# Patient Record
Sex: Female | Born: 1985 | Race: White | Hispanic: No | Marital: Single | Smoking: Former smoker
Health system: Southern US, Community
[De-identification: ages and names within clinical notes are randomized; demographics above are authoritative.]

## PROBLEM LIST (undated history)

## (undated) ENCOUNTER — Inpatient Hospital Stay: Payer: Self-pay

---

## 2015-01-21 LAB — OB RESULTS CONSOLE RPR: RPR: NONREACTIVE

## 2015-01-21 LAB — OB RESULTS CONSOLE RUBELLA ANTIBODY, IGM: RUBELLA: IMMUNE

## 2015-01-21 LAB — OB RESULTS CONSOLE HEPATITIS B SURFACE ANTIGEN: Hepatitis B Surface Ag: NEGATIVE

## 2015-01-21 LAB — OB RESULTS CONSOLE ABO/RH: RH TYPE: POSITIVE

## 2015-01-21 LAB — OB RESULTS CONSOLE VARICELLA ZOSTER ANTIBODY, IGG: Varicella: IMMUNE

## 2015-01-21 LAB — OB RESULTS CONSOLE HIV ANTIBODY (ROUTINE TESTING): HIV: NONREACTIVE

## 2015-04-02 ENCOUNTER — Inpatient Hospital Stay
Admission: EM | Admit: 2015-04-02 | Discharge: 2015-04-03 | DRG: 781 | Disposition: A | Discharge status: Home / Self Care | Payer: Medicaid Other | Attending: Obstetrics and Gynecology | Admitting: Obstetrics and Gynecology

## 2015-04-02 ENCOUNTER — Inpatient Hospital Stay: Payer: Medicaid Other

## 2015-04-02 DIAGNOSIS — O26893 Other specified pregnancy related conditions, third trimester: Secondary | ICD-10-CM | POA: Diagnosis present

## 2015-04-02 DIAGNOSIS — O99613 Diseases of the digestive system complicating pregnancy, third trimester: Principal | ICD-10-CM | POA: Diagnosis present

## 2015-04-02 DIAGNOSIS — Z3A28 28 weeks gestation of pregnancy: Secondary | ICD-10-CM

## 2015-04-02 DIAGNOSIS — E876 Hypokalemia: Secondary | ICD-10-CM | POA: Diagnosis present

## 2015-04-02 DIAGNOSIS — O99283 Endocrine, nutritional and metabolic diseases complicating pregnancy, third trimester: Secondary | ICD-10-CM | POA: Diagnosis present

## 2015-04-02 DIAGNOSIS — K219 Gastro-esophageal reflux disease without esophagitis: Secondary | ICD-10-CM | POA: Diagnosis present

## 2015-04-02 DIAGNOSIS — R109 Unspecified abdominal pain: Secondary | ICD-10-CM

## 2015-04-02 LAB — COMPREHENSIVE METABOLIC PANEL
ALK PHOS: 86 U/L (ref 38–126)
ALT: 13 U/L — AB (ref 14–54)
AST: 20 U/L (ref 15–41)
Albumin: 3.5 g/dL (ref 3.5–5.0)
Anion gap: 11 (ref 5–15)
BILIRUBIN TOTAL: 0.6 mg/dL (ref 0.3–1.2)
BUN: 7 mg/dL (ref 6–20)
CO2: 22 mmol/L (ref 22–32)
CREATININE: 0.53 mg/dL (ref 0.44–1.00)
Calcium: 8.9 mg/dL (ref 8.9–10.3)
Chloride: 107 mmol/L (ref 101–111)
GFR calc Af Amer: >60 mL/min (ref >60)
Glucose, Bld: 94 mg/dL (ref 65–99)
Potassium: 2.7 mmol/L — CL (ref 3.5–5.1)
Sodium: 140 mmol/L (ref 135–145)
TOTAL PROTEIN: 6.8 g/dL (ref 6.5–8.1)

## 2015-04-02 LAB — URINALYSIS COMPLETE WITH MICROSCOPIC (ARMC ONLY)
BILIRUBIN URINE: NEGATIVE
GLUCOSE, UA: NEGATIVE mg/dL
Hgb urine dipstick: NEGATIVE
Leukocytes, UA: NEGATIVE
Nitrite: NEGATIVE
Protein, ur: NEGATIVE mg/dL
Specific Gravity, Urine: 1.013 (ref 1.005–1.030)
pH: 6 (ref 5.0–8.0)

## 2015-04-02 LAB — CBC
HCT: 33.4 % — ABNORMAL LOW (ref 35.0–47.0)
Hemoglobin: 10.9 g/dL — ABNORMAL LOW (ref 12.0–16.0)
MCH: 28.4 pg (ref 26.0–34.0)
MCHC: 32.6 g/dL (ref 32.0–36.0)
MCV: 87.3 fL (ref 80.0–100.0)
PLATELETS: 205 10*3/uL (ref 150–440)
RBC: 3.82 MIL/uL (ref 3.80–5.20)
RDW: 12.9 % (ref 11.5–14.5)
WBC: 11.5 10*3/uL — AB (ref 3.6–11.0)

## 2015-04-02 LAB — URINE DRUG SCREEN, QUALITATIVE (ARMC ONLY)
AMPHETAMINES, UR SCREEN: NOT DETECTED
BARBITURATES, UR SCREEN: NOT DETECTED
BENZODIAZEPINE, UR SCRN: NOT DETECTED
COCAINE METABOLITE, UR ~~LOC~~: NOT DETECTED
Cannabinoid 50 Ng, Ur ~~LOC~~: NOT DETECTED
MDMA (Ecstasy)Ur Screen: NOT DETECTED
METHADONE SCREEN, URINE: NOT DETECTED
OPIATE, UR SCREEN: NOT DETECTED
PHENCYCLIDINE (PCP) UR S: NOT DETECTED
Tricyclic, Ur Screen: NOT DETECTED

## 2015-04-02 MED ORDER — POTASSIUM CHLORIDE 2 MEQ/ML IV SOLN
INTRAVENOUS | Status: DC
  Administered 2015-04-02: 19:00:00 via INTRAVENOUS
  Filled 2015-04-02 (×17): qty 1000

## 2015-04-02 MED ORDER — SODIUM CHLORIDE 0.9 % IV SOLN
80.0000 mg | Freq: Once | INTRAVENOUS | Status: AC
  Administered 2015-04-03: 80 mg via INTRAVENOUS
  Filled 2015-04-02: qty 80

## 2015-04-02 MED ORDER — POTASSIUM CHLORIDE CRYS ER 20 MEQ PO TBCR
20.0000 meq | EXTENDED_RELEASE_TABLET | Freq: Four times a day (QID) | ORAL | Status: DC
  Administered 2015-04-03 (×2): 20 meq via ORAL
  Filled 2015-04-02 (×2): qty 1

## 2015-04-02 MED ORDER — TERBUTALINE SULFATE 1 MG/ML IJ SOLN
INTRAMUSCULAR | Status: AC
  Administered 2015-04-02: 0.25 mg via SUBCUTANEOUS
  Filled 2015-04-02: qty 1

## 2015-04-02 MED ORDER — TERBUTALINE SULFATE 1 MG/ML IJ SOLN
0.2500 mg | Freq: Once | INTRAMUSCULAR | Status: AC
  Administered 2015-04-02: 0.25 mg via SUBCUTANEOUS

## 2015-04-02 NOTE — Progress Notes (Signed)
Patient ID: Doris Frye, female   DOB: 11/02/1985, 29 y.o.   MRN: [id number] Doris Frye 11/02/1985 G2 P0100  4558w3d presents for intermittent upper abd pain . Some back pain associated with it ..+ emesis x 1  noLOF , no vaginal bleeding , PMHX h/o anxiety , h/o methadone drug dependency  PSHX none PObhx : prior 22 week PTD + loss  Ros : unremarkable  Meds PNV , iron  Allergies NKDA O;BP 103/67 mmHg  Pulse 94  Temp(Src) 97.9 F (36.6 C) (Oral)  Resp 16  LMP 09/15/2014 ABDsoft NT , rebound  CX FFN done , cx long+ closed  NST reassuring , reactive , uterine irritability Labs: ua neg, CBC : CMP :   A: Upper abd pain , r/o cholelithiasis Uterine ctx , at risk for PRD given prior 22 week loss P:Gallblaader u/s  Sq terbutaline

## 2015-04-02 NOTE — OB Triage Note (Signed)
Mrs. Doris Frye reports to triage with complaints of intermittent abdominal cramps for the last month. The cramping starts in the abdomen and radiates to the back. They are sporadic, occurring every 2-3 days, lasting 30 mins-3 hrs with each episode, with pain 10/10.  Denies diarrhea, bleeding, burning with urination, headaches, swelling, visual disturbances, any pain at this moment..  Reports one occurrence of N/V, occassional constipation.

## 2015-04-02 NOTE — Progress Notes (Signed)
Patient ID: Doris Frye, female   DOB: 1986/05/18, 29 y.o.   MRN: [id number] Will do 30 min of fetal monitoring q 4 hrs and continuous toco

## 2015-04-02 NOTE — H&P (Signed)
Doris Frye is a 29 y.o. female presenting for cramping , upper abdominal pain  Which has been intermittent throughout pregnancy . EGA 28+3 week . CTX noted tonight . SHe has undergone an gallbaladder u/s which was negative and CMP which showed K+ 2.7 .Cx closed . S/P one terbutaline 0.25 mg injection  Prior h/o of stillbirth at 22+ weeks  History OB History    Gravida Para Term Preterm AB TAB SAB Ectopic Multiple Living   2 1  1  0  0   0     PMHX : methadone addiction remote  PHx; none  Family History: family history is not on file. Social History:addiction as above   Scientist, physiologicalrenatal Transfer Tool  Maternal Diabetes: No Genetic Screening: Normal Maternal Ultrasounds/Referrals: Normal Fetal Ultrasounds or other Referrals:  None Maternal Substance Abuse:  Yes:  Type: Methadone remote Significant Maternal Medications:  None Significant Maternal Lab Results:  Lab values include: Other:  Other Comments:  None  ROS  Dilation: Closed Effacement (%): Thick Station: Ballotable Exam by:: TJS Blood pressure 115/60, pulse 98, temperature 97.9 F (36.6 C), temperature source Oral, resp. rate 16, last menstrual period 09/15/2014. Exam Physical Exam  Lungs CTA  CV RRR  abd : soft , nNT  cx as above    Assessment/Plan: 28 week  Upper abd pain  With nl gallbladder u/s . Probable GERD  Hypokalemia( severe) IVF with KCL  Po diet regular  protonix 40 mg iv  Recheck CMP in am  Continuous monitoring    Doris Frye 04/02/2015, 7:21 PM

## 2015-04-03 LAB — COMPREHENSIVE METABOLIC PANEL
ALT: 12 U/L — AB (ref 14–54)
AST: 18 U/L (ref 15–41)
Albumin: 2.9 g/dL — ABNORMAL LOW (ref 3.5–5.0)
Alkaline Phosphatase: 68 U/L (ref 38–126)
Anion gap: 5 (ref 5–15)
BILIRUBIN TOTAL: 0.2 mg/dL — AB (ref 0.3–1.2)
BUN: 7 mg/dL (ref 6–20)
CALCIUM: 8.3 mg/dL — AB (ref 8.9–10.3)
CHLORIDE: 109 mmol/L (ref 101–111)
CO2: 22 mmol/L (ref 22–32)
CREATININE: 0.52 mg/dL (ref 0.44–1.00)
Glucose, Bld: 93 mg/dL (ref 65–99)
Potassium: 3.7 mmol/L (ref 3.5–5.1)
Sodium: 136 mmol/L (ref 135–145)
TOTAL PROTEIN: 5.9 g/dL — AB (ref 6.5–8.1)

## 2015-04-03 MED ORDER — SODIUM CHLORIDE 0.9 % IJ SOLN
INTRAMUSCULAR | Status: AC
  Filled 2015-04-03: qty 3

## 2015-04-03 NOTE — Discharge Summary (Signed)
Physician Discharge Summary  Patient ID: Wilmer Floorifanie Hartley MRN: [id number] DOB/AGE: 1985/11/25 29 y.o.  Admit date: 04/02/2015 Discharge date: 04/03/2015  Admission Diagnoses:[redacted] weeks EGA , Upper abd pain , Hypokalemia  Discharge Diagnoses: same as above, GERD Active Problems:   GERD (gastroesophageal reflux disease)   Discharged Condition: good  Hospital Course: pt was admitted to correct severe hypokalemia . K+on d/c 3.7 . Abd pain and ctx resolved. GERD tx with IV protonix   Consults: None  Significant Diagnostic Studies: labs: K+=2.7 corrected to 3.7.   Treatments: IV hydration and K+ replacement , IV protonix  Discharge Exam: Blood pressure 96/51, pulse 77, temperature 97.8 F (36.6 C), temperature source Oral, resp. rate 16, last menstrual period 09/15/2014. General appearance: alert and cooperative Resp: clear to auscultation bilaterally Cardio: regular rate and rhythm, S1, S2 normal, no murmur, click, rub or gallop GI: soft, non-tender; bowel sounds normal; no masses,  no organomegaly  Disposition: Final discharge disposition not confirmed  Discharge Instructions    Call MD for:  difficulty breathing, headache or visual disturbances    Complete by:  As directed      Call MD for:  extreme fatigue    Complete by:  As directed      Call MD for:  hives    Complete by:  As directed      Call MD for:  persistant dizziness or light-headedness    Complete by:  As directed      Call MD for:  persistant nausea and vomiting    Complete by:  As directed      Call MD for:  redness, tenderness, or signs of infection (pain, swelling, redness, odor or green/yellow discharge around incision site)    Complete by:  As directed      Call MD for:  severe uncontrolled pain    Complete by:  As directed      Diet - low sodium heart healthy    Complete by:  As directed      Discharge instructions    Complete by:  As directed   2 bananas a day     Increase activity slowly    Complete  by:  As directed             Medication List    Notice    You have not been prescribed any medications.         Follow-up Information    [facility]   Why:  As needed for routine care    Contact information:   [address] [place] [id number] [phone]       Signed: Jennell CornerSCHERMERHORN,Cletis Muma 04/03/2015, 8:43 AM

## 2015-04-03 NOTE — Discharge Instructions (Signed)

## 2015-05-05 ENCOUNTER — Observation Stay
Admission: EM | Admit: 2015-05-05 | Discharge: 2015-05-05 | Disposition: A | Discharge status: Home / Self Care | Payer: Medicaid Other | Attending: Obstetrics and Gynecology | Admitting: Obstetrics and Gynecology

## 2015-05-05 DIAGNOSIS — R109 Unspecified abdominal pain: Secondary | ICD-10-CM | POA: Insufficient documentation

## 2015-05-05 DIAGNOSIS — O26893 Other specified pregnancy related conditions, third trimester: Principal | ICD-10-CM | POA: Insufficient documentation

## 2015-05-05 DIAGNOSIS — R112 Nausea with vomiting, unspecified: Secondary | ICD-10-CM | POA: Insufficient documentation

## 2015-05-05 DIAGNOSIS — Z3A33 33 weeks gestation of pregnancy: Secondary | ICD-10-CM | POA: Insufficient documentation

## 2015-05-05 DIAGNOSIS — Z3490 Encounter for supervision of normal pregnancy, unspecified, unspecified trimester: Secondary | ICD-10-CM

## 2015-05-05 LAB — COMPREHENSIVE METABOLIC PANEL
ALT: 16 U/L (ref 14–54)
AST: 20 U/L (ref 15–41)
Albumin: 3.1 g/dL — ABNORMAL LOW (ref 3.5–5.0)
Alkaline Phosphatase: 105 U/L (ref 38–126)
Anion gap: 4 — ABNORMAL LOW (ref 5–15)
BUN: 8 mg/dL (ref 6–20)
CHLORIDE: 109 mmol/L (ref 101–111)
CO2: 24 mmol/L (ref 22–32)
CREATININE: 0.55 mg/dL (ref 0.44–1.00)
Calcium: 8.7 mg/dL — ABNORMAL LOW (ref 8.9–10.3)
Glucose, Bld: 100 mg/dL — ABNORMAL HIGH (ref 65–99)
POTASSIUM: 3.6 mmol/L (ref 3.5–5.1)
SODIUM: 137 mmol/L (ref 135–145)
Total Bilirubin: 0.5 mg/dL (ref 0.3–1.2)
Total Protein: 6.1 g/dL — ABNORMAL LOW (ref 6.5–8.1)

## 2015-05-05 LAB — URINALYSIS COMPLETE WITH MICROSCOPIC (ARMC ONLY)
BACTERIA UA: NONE SEEN
Bilirubin Urine: NEGATIVE
Glucose, UA: NEGATIVE mg/dL
Hgb urine dipstick: NEGATIVE
Ketones, ur: NEGATIVE mg/dL
LEUKOCYTES UA: NEGATIVE
Nitrite: NEGATIVE
PH: 6 (ref 5.0–8.0)
PROTEIN: NEGATIVE mg/dL
SPECIFIC GRAVITY, URINE: 1.014 (ref 1.005–1.030)

## 2015-05-05 LAB — CBC
HCT: 32.3 % — ABNORMAL LOW (ref 35.0–47.0)
HEMOGLOBIN: 10.7 g/dL — AB (ref 12.0–16.0)
MCH: 28.4 pg (ref 26.0–34.0)
MCHC: 33.1 g/dL (ref 32.0–36.0)
MCV: 86 fL (ref 80.0–100.0)
PLATELETS: 184 10*3/uL (ref 150–440)
RBC: 3.76 MIL/uL — AB (ref 3.80–5.20)
RDW: 13.1 % (ref 11.5–14.5)
WBC: 9.3 10*3/uL (ref 3.6–11.0)

## 2015-05-05 LAB — LIPASE, BLOOD: LIPASE: 27 U/L (ref 11–51)

## 2015-05-05 LAB — AMYLASE: AMYLASE: 57 U/L (ref 28–100)

## 2015-05-05 MED ORDER — CALCIUM CARBONATE ANTACID 500 MG PO CHEW: 2.0000 | CHEWABLE_TABLET | ORAL | Status: DC | PRN

## 2015-05-05 MED ORDER — ACETAMINOPHEN 325 MG PO TABS: 650.0000 mg | ORAL_TABLET | ORAL | Status: DC | PRN

## 2015-05-05 NOTE — OB Triage Note (Signed)
Pt oriented to obs rm 3 and placed on monitor. Pt is here with complaint of v/d starting last night with irregular contractions.  Also has c/o of upper back pain. Will cont to monitor.

## 2015-05-05 NOTE — Progress Notes (Addendum)
Patient ID: Doris Frye, female   DOB: March 28, 1986, 29 y.o.   MRN: [id number] Doris Frye March 28, 1986 G2 P0100 4351w0d presents for nausea / vo,iting and back pain started yesterday . No fever  Recent u/s shows no galbladder ds  noLOF , no vaginal bleeding , Good fetal movt  O;Temp(Src) 98.6 F (37 C) (Oral)  Ht 5' 6.5" (1.689 m)  Wt 149 lb (67.586 kg)  BMI 23.69 kg/m2  LMP 09/15/2014 PMHX h/o anxiety , h/o methadone drug dependency  PSHX none PObhx : prior 22 week PTD + loss  Ros : unremarkable  Meds PNV , iron  Allergies NKDA O;BP 103/67 mmHg  Pulse 94  Temp(Src) 97.9 F (36.6 C) (Oral)  Resp 16  LMP 09/15/2014 ABDsoft NT , no rebound  CX FFN done , cx long+ closed  NST reassuring , reactive , uterine irritability Labs: ua neg, CBC : CMP : amylase + lipase =all normal   A: Upper abd pain ,probable Viral GE , benign exam  P:zofran 8 mg tid prn  Imodium prn  Daily fetal kick counts  RTC if fever or sx worse

## 2015-05-05 NOTE — Discharge Instructions (Signed)
Please drink plenty of water and and rest. Please contact your OB for further questions and concern. In an emergency go to the [facility] Emergency Department.   Rx. Given Zofran 8 mg every hours as needed for nausea and vomiting.

## 2015-05-05 NOTE — OB Triage Note (Signed)
Pt stating that abdominal pain is in the upper gastric area, aching that sometimes leads to vomiting. After vomiting this morning that pain stayed and was not relieved. She called OB office and they suggested [facility] to be [facility].

## 2015-05-25 NOTE — L&D Delivery Note (Signed)
Operative Delivery Note At 11:37 PM a viable and healthy female "[name]" was delivered via Vaginal, Spontaneous Delivery.  Presentation: compound left arm; Position: Left,, Occiput,, Anterior; Station: +4.  Delivery of the head: 06/26/2015 11:37 PM First maneuver: 06/26/2015 11:38 PM, McRoberts Second maneuver: 06/26/2015 11:38 PM, Suprapubic Pressure McRoberts Third maneuver: 06/26/2015 11:39 AM,  Delivery of posterior arm  Verbal consent: obtained from patient.  APGAR: 2, 7; weight 8 lb 12 oz (3969 g).   Placenta status: Intact, Spontaneous.   Cord: 3 vessels with the following complications: .  Cord pH: 7.18, 7.4 deficit  Anesthesia: Epidural Local  Episiotomy:   Lacerations: Vaginal;Labial Suture Repair: 2.0 3.0 vicryl vicryl rapide Est. Blood Loss (mL): 250  Mom to postpartum.  Baby to NICU for transition, but was doing well.  Doris Frye 06/27/2015, 12:31 AM

## 2015-06-25 ENCOUNTER — Other Ambulatory Visit: Payer: Self-pay | Admitting: Obstetrics and Gynecology

## 2015-06-25 ENCOUNTER — Encounter: Payer: Self-pay | Admitting: *Deleted

## 2015-06-25 ENCOUNTER — Inpatient Hospital Stay
Admission: AD | Admit: 2015-06-25 | Discharge: 2015-06-28 | DRG: 775 | Disposition: A | Discharge status: Home / Self Care | Payer: Medicaid Other | Source: Ambulatory Visit | Attending: Obstetrics and Gynecology | Admitting: Obstetrics and Gynecology

## 2015-06-25 DIAGNOSIS — Z3A40 40 weeks gestation of pregnancy: Secondary | ICD-10-CM | POA: Diagnosis not present

## 2015-06-25 DIAGNOSIS — Z87891 Personal history of nicotine dependence: Secondary | ICD-10-CM | POA: Diagnosis not present

## 2015-06-25 DIAGNOSIS — O480 Post-term pregnancy: Secondary | ICD-10-CM | POA: Diagnosis present

## 2015-06-25 LAB — URINE DRUG SCREEN, QUALITATIVE (ARMC ONLY)
AMPHETAMINES, UR SCREEN: NOT DETECTED
BARBITURATES, UR SCREEN: NOT DETECTED
BENZODIAZEPINE, UR SCRN: NOT DETECTED
Cannabinoid 50 Ng, Ur ~~LOC~~: NOT DETECTED
Cocaine Metabolite,Ur ~~LOC~~: NOT DETECTED
MDMA (Ecstasy)Ur Screen: NOT DETECTED
METHADONE SCREEN, URINE: NOT DETECTED
OPIATE, UR SCREEN: NOT DETECTED
PHENCYCLIDINE (PCP) UR S: NOT DETECTED
Tricyclic, Ur Screen: NOT DETECTED

## 2015-06-25 LAB — CBC
HCT: 35.2 % (ref 35.0–47.0)
Hemoglobin: 12 g/dL (ref 12.0–16.0)
MCH: 29.1 pg (ref 26.0–34.0)
MCHC: 34.1 g/dL (ref 32.0–36.0)
MCV: 85.2 fL (ref 80.0–100.0)
Platelets: 194 10*3/uL (ref 150–440)
RBC: 4.13 MIL/uL (ref 3.80–5.20)
RDW: 13.6 % (ref 11.5–14.5)
WBC: 10.6 10*3/uL (ref 3.6–11.0)

## 2015-06-25 LAB — ABO/RH: ABO/RH(D): A POS

## 2015-06-25 LAB — TYPE AND SCREEN
ABO/RH(D): A POS
Antibody Screen: NEGATIVE

## 2015-06-25 MED ORDER — DINOPROSTONE 10 MG VA INST
10.0000 mg | VAGINAL_INSERT | Freq: Once | VAGINAL | Status: AC
Start: 2015-06-25 — End: 2015-06-25
  Administered 2015-06-25: 10 mg via VAGINAL
  Filled 2015-06-25 (×2): qty 1

## 2015-06-25 MED ORDER — OXYTOCIN BOLUS FROM INFUSION
500.0000 mL | INTRAVENOUS | Status: DC
  Administered 2015-06-26: 500 mL via INTRAVENOUS

## 2015-06-25 MED ORDER — CITRIC ACID-SODIUM CITRATE 334-500 MG/5ML PO SOLN: 30.0000 mL | ORAL | Status: DC | PRN

## 2015-06-25 MED ORDER — BUTORPHANOL TARTRATE 1 MG/ML IJ SOLN
1.0000 mg | INTRAMUSCULAR | Status: DC | PRN
  Administered 2015-06-26: 1 mg via INTRAVENOUS
  Filled 2015-06-25: qty 1

## 2015-06-25 MED ORDER — TERBUTALINE SULFATE 1 MG/ML IJ SOLN: 0.2500 mg | Freq: Once | INTRAMUSCULAR | Status: DC | PRN

## 2015-06-25 MED ORDER — LACTATED RINGERS IV SOLN
INTRAVENOUS | Status: DC
  Administered 2015-06-25 – 2015-06-26 (×3): via INTRAVENOUS

## 2015-06-25 MED ORDER — LACTATED RINGERS IV SOLN: 500.0000 mL | INTRAVENOUS | Status: DC | PRN

## 2015-06-25 MED ORDER — ONDANSETRON HCL 4 MG/2ML IJ SOLN: 4.0000 mg | Freq: Four times a day (QID) | INTRAMUSCULAR | Status: DC | PRN

## 2015-06-25 MED ORDER — LIDOCAINE HCL (PF) 1 % IJ SOLN
30.0000 mL | INTRAMUSCULAR | Status: AC | PRN
  Administered 2015-06-26: 3 mL via SUBCUTANEOUS

## 2015-06-25 MED ORDER — ACETAMINOPHEN 325 MG PO TABS
650.0000 mg | ORAL_TABLET | ORAL | Status: DC | PRN
  Administered 2015-06-26 – 2015-06-27 (×4): 650 mg via ORAL
  Filled 2015-06-25 (×4): qty 2

## 2015-06-25 MED ORDER — OXYTOCIN 10 UNIT/ML IJ SOLN
2.5000 [IU]/h | INTRAVENOUS | Status: DC
  Filled 2015-06-25: qty 4

## 2015-06-25 MED ORDER — ZOLPIDEM TARTRATE 5 MG PO TABS
5.0000 mg | ORAL_TABLET | Freq: Every evening | ORAL | Status: DC | PRN
  Administered 2015-06-25: 5 mg via ORAL
  Filled 2015-06-25: qty 1

## 2015-06-25 NOTE — H&P (Signed)
OB ADMISSION/ HISTORY & PHYSICAL:  Admission Date: 06/25/15 Admit Diagnosis: Elective IOL for postdates at 40+2 weeks   Doris Frye is a 30 y.o. female presenting for elective IOL for postdates at 40+2 weeks.   Prenatal History: G2P0100 , by UNSURE LMP of 09/06/14, confirmed by [place] at 8+2 weeks with EDD of 06/23/15 EDC : 06/23/2015, by Ultrasound  P[facility]atal [facility] at [facility]  Prenatal course complicated by hx. Of Methadone - has been through 3 detox programs and clean for 1.5 years, hx of fetal demise at 22 weeks for cord accident   Prenatal Labs: ABO, Rh: A/Positive/-- (08/30 0000) Antibody:  Negative Rubella: Immune (08/30 0000)  RPR: Nonreactive (08/30 0000)  HBsAg: Negative (08/30 0000)  HIV: Non-reactive (08/30 0000)  GTT: 119 GBS:   Negative Varicella: Immune  Medical / Surgical History :  Past medical history: No past medical history on file.   Past surgical history: No past surgical history on file.  Family History: No family history on file.   Social History:  reports that she quit smoking about 9 months ago. She has never used smokeless tobacco. She reports that she does not drink alcohol or use illicit drugs.   Allergies: Review of patient's allergies indicates no known allergies.    Current Medications at time of admission:  Prior to Admission medications   Medication Sig Start Date End Date Taking? Authorizing Provider  famotidine (PEPCID) 20 MG tablet Take 20 mg by mouth 2 (two) times daily.    Historical Provider, MD  ferrous fumarate (HEMOCYTE - 106 MG FE) 325 (106 FE) MG TABS tablet Take 1 tablet by mouth.    Historical Provider, MD  Prenatal Vit-Fe Fumarate-FA (MULTIVITAMIN-PRENATAL) 27-0.8 MG TABS tablet Take 1 tablet by mouth daily at 12 noon.    Historical Provider, MD     Review of Systems: Active FM Irregular ctxs Denies LOF, VB, dysuria, abnormal discharge, HA, N/V/C/D   Physical Exam:  VS: Last menstrual period 09/15/2014.  General:  alert and oriented, appears calm Heart: RRR Lungs: Clear lung fields Abdomen: Gravid, soft and non-tender, non-distended / uterus: gravid, non-tender Extremities: no edema  Genitalia / VE:  FTP/50%/-3/vtx    Assessment: 40+[redacted] weeks gestation IOL stage of labor for elective postdates   Plan:  1. [facility] for [facility]     - Routine labor and delivery orders     -Cervidil induction - insert vaginally x 1     - May have Stadol PRN 2. Hx. Of Methadone Addiction- has been clean for 1.5 years    -UDS today 3. Contraception:    -Planning IUD  4. Anticipate NSVD      Dr. Laverle Patter notified of admission / plan of care  Carlean Jews, CNM

## 2015-06-25 NOTE — H&P (Signed)
OB ADMISSION/ HISTORY & PHYSICAL:  Admission Date: 06/25/15 Admit Diagnosis: Elective IOL for postdates at 40+2 weeks   Doris Frye is a 30 y.o. female presenting for elective IOL for postdates at 40+2 weeks.   Prenatal History: G2P0100 , by UNSURE LMP of 09/06/14, confirmed by [place] at 8+2 weeks with EDD of 06/23/15 EDC : 06/23/2015, by Ultrasound  Prenatal care at [facility]  Prenatal course complicated by hx. Of Methadone - has been through 3 detox programs and clean for 1.5 years, hx of fetal demise at 22 weeks for cord accident   Prenatal Labs: ABO, Rh: A/Positive/-- (08/30 0000) Antibody:  Negative Rubella: Immune (08/30 0000)  RPR: Nonreactive (08/30 0000)  HBsAg: Negative (08/30 0000)  HIV: Non-reactive (08/30 0000)  GTT: 119 GBS:   Negative Varicella: Immune  Medical / Surgical History :  Past medical history: No past medical history on file.   Past surgical history: No past surgical history on file.  Family History: No family history on file.   Social History:  reports that she quit smoking about 9 months ago. She has never used smokeless tobacco. She reports that she does not drink alcohol or use illicit drugs.   Allergies: Review of patient's allergies indicates no known allergies.   Current Medications at time of admission:  Prior to Admission medications   Medication Sig Start Date End Date Taking? Authorizing Provider  famotidine (PEPCID) 20 MG tablet Take 20 mg by mouth 2 (two) times daily.    Historical Provider, MD  ferrous fumarate (HEMOCYTE - 106 MG FE) 325 (106 FE) MG TABS tablet Take 1 tablet by mouth.    Historical Provider, MD  Prenatal Vit-Fe Fumarate-FA (MULTIVITAMIN-PRENATAL) 27-0.8 MG TABS tablet Take 1 tablet by mouth daily at 12 noon.    Historical Provider, MD     Review of Systems: Active FM Irregular ctxs Denies LOF, VB, dysuria, abnormal discharge, HA, N/V/C/D   Physical Exam:  VS: Last menstrual  period 09/15/2014. BP 106/82 mmHg  Pulse 101  Temp(Src) 98.1 F (36.7 C) (Oral)  Resp 16  Ht  (1.676 m)  Wt 73.936 kg (163 lb)  BMI 26.32 kg/m2  LMP 09/15/2014   General: alert and oriented, appears calm Lungs: normal effort Abdomen: Gravid, soft and non-tender, non-distended / uterus: gravid, non-tender, vtx by leopolds, 3200g EFW Extremities: no edema  Genitalia / VE:  FTP/50%/-3/vtx per RN cervadil placed    Assessment: 40+[redacted] weeks gestation IOL stage of labor for elective postdates GBS neg  Plan: 1. [facility] for [facility]  - Routine labor and delivery orders  -Cervidil induction - insert vaginally x 1  - May have Stadol PRN 2. Hx. Of Methadone Addiction- has been clean for 1.5 years  -UDS today 3. Contraception:  -Planning IUD  4. Anticipate NSVD

## 2015-06-26 ENCOUNTER — Inpatient Hospital Stay: Payer: Medicaid Other | Admitting: Certified Registered Nurse Anesthetist

## 2015-06-26 ENCOUNTER — Encounter: Payer: Self-pay | Admitting: Anesthesiology

## 2015-06-26 MED ORDER — MISOPROSTOL 200 MCG PO TABS
ORAL_TABLET | ORAL | Status: AC
  Filled 2015-06-26: qty 4

## 2015-06-26 MED ORDER — LIDOCAINE-EPINEPHRINE (PF) 1.5 %-1:200000 IJ SOLN
INTRAMUSCULAR | Status: DC | PRN
  Administered 2015-06-26: 3 mg via EPIDURAL

## 2015-06-26 MED ORDER — NALOXONE HCL 0.4 MG/ML IJ SOLN: 0.4000 mg | INTRAMUSCULAR | Status: DC | PRN

## 2015-06-26 MED ORDER — NALBUPHINE HCL 10 MG/ML IJ SOLN: 5.0000 mg | INTRAMUSCULAR | Status: DC | PRN

## 2015-06-26 MED ORDER — AMMONIA AROMATIC IN INHA
RESPIRATORY_TRACT | Status: AC
  Filled 2015-06-26: qty 10

## 2015-06-26 MED ORDER — DIPHENHYDRAMINE HCL 25 MG PO CAPS: 25.0000 mg | ORAL_CAPSULE | ORAL | Status: DC | PRN

## 2015-06-26 MED ORDER — OXYTOCIN 40 UNITS IN LACTATED RINGERS INFUSION - SIMPLE MED
1.0000 m[IU]/min | INTRAVENOUS | Status: DC
  Administered 2015-06-26: 1 m[IU]/min via INTRAVENOUS
  Filled 2015-06-26: qty 1000

## 2015-06-26 MED ORDER — ROPIVACAINE HCL 2 MG/ML IJ SOLN: INTRAMUSCULAR | Status: DC | PRN

## 2015-06-26 MED ORDER — FENTANYL 2.5 MCG/ML W/ROPIVACAINE 0.2% IN NS 100 ML EPIDURAL INFUSION (ARMC-ANES)
10.0000 mL/h | EPIDURAL | Status: DC
Start: 2015-06-26 — End: 2015-06-27

## 2015-06-26 MED ORDER — SODIUM CHLORIDE 0.9% FLUSH: 3.0000 mL | INTRAVENOUS | Status: DC | PRN

## 2015-06-26 MED ORDER — OXYTOCIN 10 UNIT/ML IJ SOLN
INTRAMUSCULAR | Status: AC
Start: 2015-06-26 — End: 2015-06-27
  Filled 2015-06-26: qty 2

## 2015-06-26 MED ORDER — NALOXONE HCL 2 MG/2ML IJ SOSY
1.0000 ug/kg/h | PREFILLED_SYRINGE | INTRAVENOUS | Status: DC | PRN
  Filled 2015-06-26: qty 2

## 2015-06-26 MED ORDER — LIDOCAINE HCL (PF) 1 % IJ SOLN
INTRAMUSCULAR | Status: AC
  Administered 2015-06-26: 3 mL via SUBCUTANEOUS
  Filled 2015-06-26: qty 30

## 2015-06-26 MED ORDER — NALBUPHINE HCL 10 MG/ML IJ SOLN: 5.0000 mg | Freq: Once | INTRAMUSCULAR | Status: DC | PRN

## 2015-06-26 MED ORDER — DIPHENHYDRAMINE HCL 50 MG/ML IJ SOLN: 12.5000 mg | INTRAMUSCULAR | Status: DC | PRN

## 2015-06-26 MED ORDER — BUPIVACAINE HCL (PF) 0.25 % IJ SOLN
INTRAMUSCULAR | Status: DC | PRN
  Administered 2015-06-26 (×2): 4 mL via EPIDURAL

## 2015-06-26 MED ORDER — FENTANYL 2.5 MCG/ML W/ROPIVACAINE 0.2% IN NS 100 ML EPIDURAL INFUSION (ARMC-ANES)
EPIDURAL | Status: AC
  Administered 2015-06-26: 10 mL/h via EPIDURAL
  Filled 2015-06-26: qty 100

## 2015-06-26 MED ORDER — ONDANSETRON HCL 4 MG/2ML IJ SOLN: 4.0000 mg | Freq: Three times a day (TID) | INTRAMUSCULAR | Status: DC | PRN

## 2015-06-26 NOTE — Anesthesia Preprocedure Evaluation (Signed)
Anesthesia Evaluation  Patient identified by MRN, date of birth, ID band Patient awake    Reviewed: Allergy & Precautions, H&P , Patient's Chart, lab work & pertinent test results  History of Anesthesia Complications Negative for: history of anesthetic complications  Airway Mallampati: II  TM Distance: >3 FB Neck ROM: full    Dental  (+) Teeth Intact   Pulmonary former smoker,    Pulmonary exam normal        Cardiovascular Exercise Tolerance: Good negative cardio ROS Normal cardiovascular exam     Neuro/Psych    GI/Hepatic Neg liver ROS,   Endo/Other  negative endocrine ROS  Renal/GU negative Renal ROS     Musculoskeletal   Abdominal   Peds  Hematology negative hematology ROS (+)   Anesthesia Other Findings   Reproductive/Obstetrics (+) Pregnancy                             Anesthesia Physical  Anesthesia Plan  ASA: II  Anesthesia Plan: Epidural   Post-op Pain Management:    Induction:   Airway Management Planned:   Additional Equipment:   Intra-op Plan:   Post-operative Plan:   Informed Consent: I have reviewed the patients History and Physical, chart, labs and discussed the procedure including the risks, benefits and alternatives for the proposed anesthesia with the patient or authorized representative who has indicated his/her understanding and acceptance.     Plan Discussed with: Anesthesiologist  Anesthesia Plan Comments:         Anesthesia Quick Evaluation

## 2015-06-26 NOTE — Anesthesia Preprocedure Evaluation (Signed)
Anesthesia Evaluation  Patient identified by MRN, date of birth, ID band Patient awake    Reviewed: Allergy & Precautions, H&P , Patient's Chart, lab work & pertinent test results  History of Anesthesia Complications Negative for: history of anesthetic complications  Airway Mallampati: II  TM Distance: >3 FB Neck ROM: full    Dental  (+) Teeth Intact   Pulmonary former smoker,    Pulmonary exam normal        Cardiovascular Exercise Tolerance: Good negative cardio ROS Normal cardiovascular exam     Neuro/Psych    GI/Hepatic Neg liver ROS, Controlled,  Endo/Other  negative endocrine ROS  Renal/GU negative Renal ROS     Musculoskeletal   Abdominal   Peds  Hematology negative hematology ROS (+)   Anesthesia Other Findings   Reproductive/Obstetrics (+) Pregnancy                             Anesthesia Physical Anesthesia Plan  ASA: II  Anesthesia Plan: Epidural   Post-op Pain Management:    Induction:   Airway Management Planned:   Additional Equipment:   Intra-op Plan:   Post-operative Plan:   Informed Consent: I have reviewed the patients History and Physical, chart, labs and discussed the procedure including the risks, benefits and alternatives for the proposed anesthesia with the patient or authorized representative who has indicated his/her understanding and acceptance.     Plan Discussed with: Anesthesiologist  Anesthesia Plan Comments:         Anesthesia Quick Evaluation

## 2015-06-26 NOTE — Progress Notes (Signed)
Doris Frye is a 30 y.o. G2P0100 at [redacted]w[redacted]d for elective IOL.  Please see hand written notes in chart for progress notes  Subjective: Feeling comfortable with epidiural  Objective: BP 111/71 mmHg  Pulse 93  Temp(Src) 98.2 F (36.8 C) (Oral)  Resp 18  Ht  (1.676 m)  Wt 73.936 kg (163 lb)  BMI 26.32 kg/m2  SpO2 100%  LMP 09/15/2014 I/O last 3 completed shifts: In: 125 [I.V.:125] Out: -  Total I/O In: 1778.3 [I.V.:1768.3; Other:10] Out: -   FHT:  FHR: 130 bpm, variability: moderate,  accelerations:  Present,  decelerations:  Absent UC:   regular, every 2-4 minutes SVE:   Dilation: 5 Effacement (%): 70 Station: 0 Exam by:: BEB   AROM for Clear fluid large amount  Labs: Lab Results  Component Value Date   WBC 10.6 06/25/2015   HGB 12.0 06/25/2015   HCT 35.2 06/25/2015   MCV 85.2 06/25/2015   PLT 194 06/25/2015    Assessment / Plan: Induction of labor due to elective decision ,  progressing well on pitocin  Labor: progressing normally  Fetal Wellbeing:  Category I Pain Control:  Epidural I/D:  n/a Anticipated MOD:  NSVD  Eiden Bagot 06/26/2015, 6:10 PM

## 2015-06-26 NOTE — Anesthesia Procedure Notes (Signed)
Epidural Patient location during procedure: OB Start time: 06/26/2015 3:29 PM End time: 06/26/2015 3:43 PM  Staffing Anesthesiologist: Rosaria Ferries Resident/CRNA: Ginger Carne Performed by: resident/CRNA   Preanesthetic Checklist Completed: patient identified, site marked, surgical consent, pre-op evaluation, timeout performed, IV checked, risks and benefits discussed and monitors and equipment checked  Epidural Patient position: sitting Prep: Betadine Patient monitoring: heart rate, continuous pulse ox and blood pressure Approach: midline Location: L3-L4 Injection technique: LOR saline  Needle:  Needle type: Tuohy  Needle gauge: 18 G Needle length: 9 cm and 9 Needle insertion depth: 6 cm Catheter type: closed end flexible Catheter size: 20 Guage Catheter at skin depth: 12 cm Test dose: negative and 1.5% lidocaine with Epi 1:200 K  Assessment Events: blood not aspirated, injection not painful, no injection resistance, negative IV test and no paresthesia  Additional Notes   Patient tolerated the insertion well without complications.Reason for block:procedure for pain

## 2015-06-26 NOTE — Progress Notes (Signed)
Patient ID: Doris Frye, female   DOB: Dec 08, 1985, 30 y.o.   MRN: [id number]  Assuming care. Elective IOL, on cervadil.  Cat I strip, ctx q6 min. Plan for pitocin, eventual AROM, conitnuous fetal monitoring. Epidural when desired.

## 2015-06-26 NOTE — Progress Notes (Signed)
Doris Frye is a 30 y.o. G2P0100 at [redacted]w[redacted]d   Subjective: Pt vomiting, feeling pressure  Objective: BP 116/71 mmHg  Pulse 103  Temp(Src) 98 F (36.7 C) (Oral)  Resp 16  Ht  (1.676 m)  Wt 73.936 kg (163 lb)  BMI 26.32 kg/m2  SpO2 100%  LMP 09/15/2014 I/O last 3 completed shifts: In: 1903.3 [I.V.:1893.3; Other:10] Out: -     FHT:  FHR: 145, mod var, +accels, no decels  UC:   regular, every q3 minutes, pit at 9 SVE:   Dilation: Lip/rim Effacement (%): 100 Station: +1, +2 Exam by:: [employer]: Lab Results  Component Value Date   WBC 10.6 06/25/2015   HGB 12.0 06/25/2015   HCT 35.2 06/25/2015   MCV 85.2 06/25/2015   PLT 194 06/25/2015    Assessment / Plan:  Practice push with excellent effort, but anterior lip remains. Will recheck in 30 min. Anticipate vaginal delivery.  Christeen Douglas 06/26/2015, 10:27 PM

## 2015-06-27 LAB — RPR: RPR: NONREACTIVE

## 2015-06-27 MED ORDER — IBUPROFEN 600 MG PO TABS
600.0000 mg | ORAL_TABLET | Freq: Four times a day (QID) | ORAL | Status: DC | PRN
  Administered 2015-06-27 (×2): 600 mg via ORAL
  Filled 2015-06-27 (×2): qty 1

## 2015-06-27 NOTE — Anesthesia Post-op Follow-up Note (Signed)
  Anesthesia Pain Follow-up Note  Patient: Doris Frye  Day #: 1  Date of Follow-up: 06/27/2015 Time: 7:21 AM  Last Vitals:  Filed Vitals:   06/27/15 0150 06/27/15 0242  BP: 120/64 108/62  Pulse: 90 81  Temp:  36.8 C  Resp:  20    Level of Consciousness: alert  Pain: mild   Side Effects:None  Catheter Site Exam:site not evaluated  Plan: D/C from anesthesia care  Clydene Pugh

## 2015-06-27 NOTE — Anesthesia Postprocedure Evaluation (Signed)
Anesthesia Post Note  Patient: Doris Frye  Procedure(s) Performed: * No procedures listed *  Patient location during evaluation: Nursing Unit Anesthesia Type: Epidural Level of consciousness: awake and alert and oriented Pain management: satisfactory to patient Vital Signs Assessment: post-procedure vital signs reviewed and stable Respiratory status: respiratory function stable Cardiovascular status: stable Postop Assessment: no headache and no backache Anesthetic complications: no    Last Vitals:  Filed Vitals:   06/27/15 0150 06/27/15 0242  BP: 120/64 108/62  Pulse: 90 81  Temp:  36.8 C  Resp:  20    Last Pain:  Filed Vitals:   06/27/15 0301  PainSc: 0-No pain                 Clydene Pugh

## 2015-06-27 NOTE — Progress Notes (Signed)
Post Partum Day 1 Subjective: no complaints, up ad lib, voiding, tolerating PO and + flatus  Objective: Blood pressure 111/74, pulse 78, temperature 98.5 F (36.9 C), temperature source Oral, resp. rate 18, height  (1.676 m), weight 73.936 kg (163 lb), last menstrual period 09/15/2014, SpO2 98 %, unknown if currently breastfeeding.  Physical Exam:  General: alert, cooperative, appears stated age and no distress Lochia: appropriate Uterine Fundus: firm Incision: healing well DVT Evaluation: No evidence of DVT seen on physical exam.   Recent Labs  06/25/15 2049  HGB 12.0  HCT 35.2    Assessment/Plan: Plan for discharge tomorrow, Breastfeeding and Contraception pp IUD  PP c/b shoulder dystocia x 2 min, apgars 2, 7, infant doing well at bedside   LOS: 2 days   Ala Dach 06/27/2015, 8:53 AM

## 2015-06-28 LAB — CBC
HCT: 29.4 % — ABNORMAL LOW (ref 35.0–47.0)
Hemoglobin: 9.9 g/dL — ABNORMAL LOW (ref 12.0–16.0)
MCH: 29.6 pg (ref 26.0–34.0)
MCHC: 33.7 g/dL (ref 32.0–36.0)
MCV: 87.9 fL (ref 80.0–100.0)
PLATELETS: 163 10*3/uL (ref 150–440)
RBC: 3.35 MIL/uL — AB (ref 3.80–5.20)
RDW: 13.8 % (ref 11.5–14.5)
WBC: 9.3 10*3/uL (ref 3.6–11.0)

## 2015-06-28 MED ORDER — ZOLPIDEM TARTRATE 5 MG PO TABS: 5.0000 mg | ORAL_TABLET | Freq: Every evening | ORAL | Status: DC | PRN

## 2015-06-28 MED ORDER — SODIUM CHLORIDE 0.9% FLUSH: 3.0000 mL | Freq: Two times a day (BID) | INTRAVENOUS | Status: DC

## 2015-06-28 MED ORDER — TETANUS-DIPHTH-ACELL PERTUSSIS 5-2.5-18.5 LF-MCG/0.5 IM SUSP: 0.5000 mL | Freq: Once | INTRAMUSCULAR | Status: DC

## 2015-06-28 MED ORDER — SENNOSIDES-DOCUSATE SODIUM 8.6-50 MG PO TABS: 2.0000 | ORAL_TABLET | ORAL | Status: AC

## 2015-06-28 MED ORDER — SIMETHICONE 80 MG PO CHEW: 80.0000 mg | CHEWABLE_TABLET | ORAL | Status: DC | PRN

## 2015-06-28 MED ORDER — ONDANSETRON HCL 4 MG/2ML IJ SOLN: 4.0000 mg | INTRAMUSCULAR | Status: DC | PRN

## 2015-06-28 MED ORDER — ACETAMINOPHEN 325 MG PO TABS
650.0000 mg | ORAL_TABLET | ORAL | Status: DC | PRN
  Administered 2015-06-28: 650 mg via ORAL
  Filled 2015-06-28: qty 2

## 2015-06-28 MED ORDER — PRENATAL MULTIVITAMIN CH
1.0000 | ORAL_TABLET | Freq: Every day | ORAL | Status: DC
  Administered 2015-06-28: 1 via ORAL
  Filled 2015-06-28: qty 1

## 2015-06-28 MED ORDER — DIBUCAINE 1 % RE OINT: 1.0000 "application " | TOPICAL_OINTMENT | RECTAL | Status: DC | PRN

## 2015-06-28 MED ORDER — DIPHENHYDRAMINE HCL 25 MG PO CAPS: 25.0000 mg | ORAL_CAPSULE | Freq: Four times a day (QID) | ORAL | Status: DC | PRN

## 2015-06-28 MED ORDER — DOCUSATE SODIUM 100 MG PO CAPS
100.0000 mg | ORAL_CAPSULE | Freq: Two times a day (BID) | ORAL | Status: DC
  Administered 2015-06-28 (×2): 100 mg via ORAL
  Filled 2015-06-28 (×2): qty 1

## 2015-06-28 MED ORDER — LANOLIN HYDROUS EX OINT: TOPICAL_OINTMENT | CUTANEOUS | Status: DC | PRN

## 2015-06-28 MED ORDER — IBUPROFEN 600 MG PO TABS
600.0000 mg | ORAL_TABLET | Freq: Four times a day (QID) | ORAL | Status: DC
  Administered 2015-06-28 (×2): 600 mg via ORAL
  Filled 2015-06-28 (×2): qty 1

## 2015-06-28 MED ORDER — SODIUM CHLORIDE 0.9 % IV SOLN: 250.0000 mL | INTRAVENOUS | Status: DC | PRN

## 2015-06-28 MED ORDER — BENZOCAINE-MENTHOL 20-0.5 % EX AERO
1.0000 "application " | INHALATION_SPRAY | CUTANEOUS | Status: DC | PRN
  Filled 2015-06-28: qty 56

## 2015-06-28 MED ORDER — ONDANSETRON HCL 4 MG PO TABS: 4.0000 mg | ORAL_TABLET | ORAL | Status: DC | PRN

## 2015-06-28 MED ORDER — SENNOSIDES-DOCUSATE SODIUM 8.6-50 MG PO TABS
2.0000 | ORAL_TABLET | ORAL | Status: DC
  Administered 2015-06-28: 2 via ORAL
  Filled 2015-06-28: qty 2

## 2015-06-28 MED ORDER — MEDROXYPROGESTERONE ACETATE 150 MG/ML IM SUSP
150.0000 mg | Freq: Once | INTRAMUSCULAR | Status: AC
  Administered 2015-06-28: 150 mg via INTRAMUSCULAR
  Filled 2015-06-28: qty 1

## 2015-06-28 MED ORDER — WITCH HAZEL-GLYCERIN EX PADS: 1.0000 "application " | MEDICATED_PAD | CUTANEOUS | Status: DC | PRN

## 2015-06-28 MED ORDER — SODIUM CHLORIDE 0.9% FLUSH: 3.0000 mL | INTRAVENOUS | Status: DC | PRN

## 2015-06-28 NOTE — Discharge Summary (Addendum)
Obstetric Discharge Summary Reason for Admission: induction of labor Prenatal Procedures: none Intrapartum Procedures: spontaneous vaginal delivery Postpartum Procedures: none Complications-Operative and Postpartum: 2nd degree perineal laceration and vaginal laceration HEMOGLOBIN  Date Value Ref Range Status  06/28/2015 9.9* 12.0 - 16.0 g/dL Final   HCT  Date Value Ref Range Status  06/28/2015 29.4* 35.0 - 47.0 % Final    Physical Exam:  General: alert, cooperative, appears stated age and no distress Lochia: appropriate Uterine Fundus: firm Incision: healing well DVT Evaluation: No evidence of DVT seen on physical exam.  Discharge Diagnoses: Term Pregnancy-delivered  Discharge Information: Date: 06/28/2015 Activity: pelvic rest Diet: routine Medications: Ibuprofen and Colace Condition: stable Instructions: refer to practice specific booklet Discharge to: home   Newborn Data: Live born female  Birth Weight: 8 lb 12 oz (3969 g) APGAR: 2, 7  Home with mother. Depo for birth control Breast feeding  Doris Frye 06/28/2015, 9:30 AM

## 2015-06-28 NOTE — Progress Notes (Signed)
Patient discharged home with infant. Discharge instructions, prescriptions and follow up appointment given to and reviewed with patient and family. Patient verbalized understanding. Escorted out via wheelchair by auxillary.

## 2016-05-26 IMAGING — US US ABDOMEN LIMITED
1 series · 14 of 25 positions shown · non-contrast
Comparison: None.

CLINICAL DATA: Patient with right upper quadrant abdominal pain,
intermittent for 2 weeks. Nausea and vomiting. Patient is 28 weeks
pregnant.

EXAM:
US ABDOMEN LIMITED - RIGHT UPPER QUADRANT

[Series 1: us abdomen limited · 0.21mm/px · 14 of 47 slices shown]
[im 1/47]
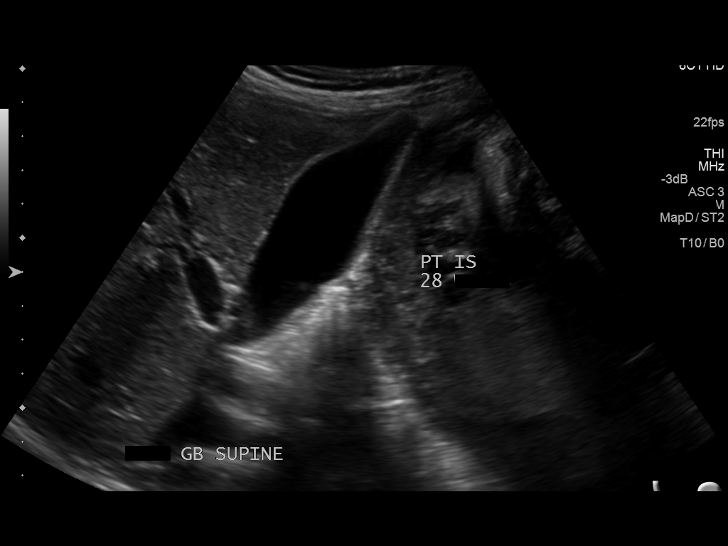
[im 4/47]
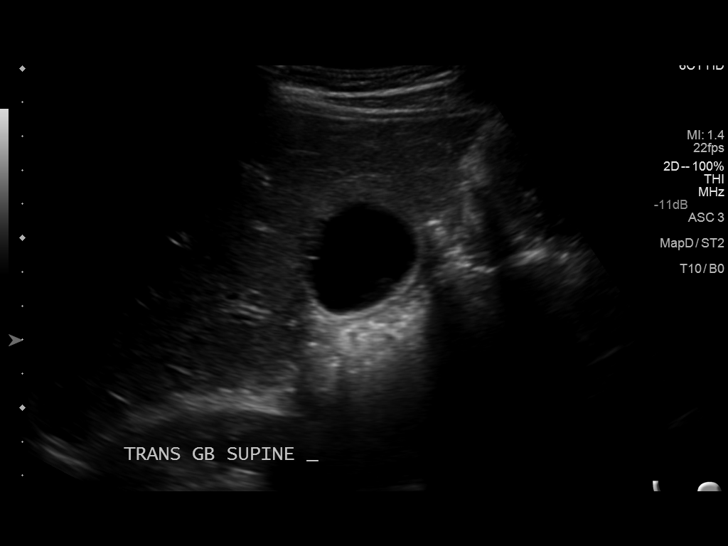
[im 8/47]
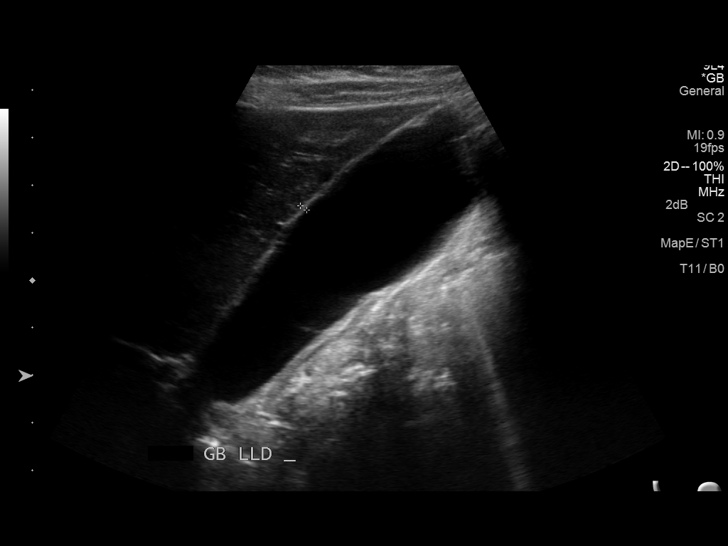
[im 12/47]
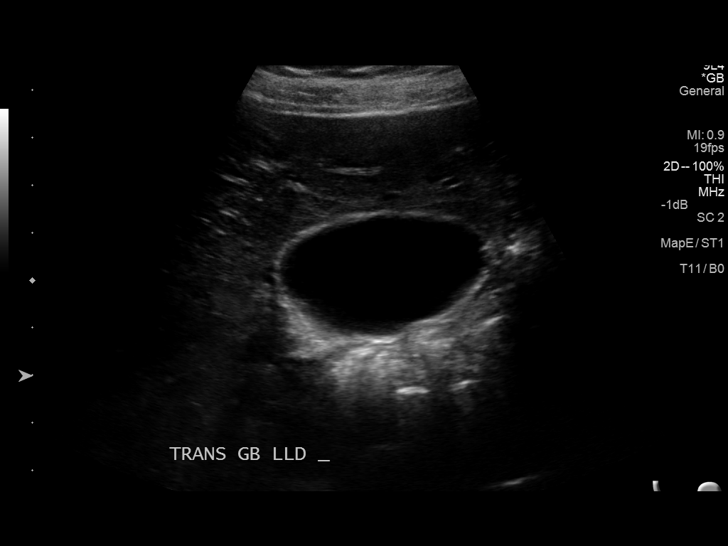
[im 16/47]
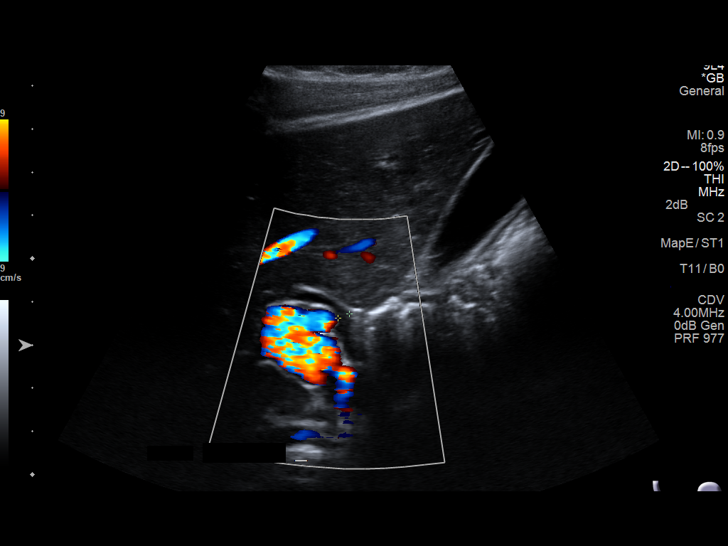
[im 18/47]
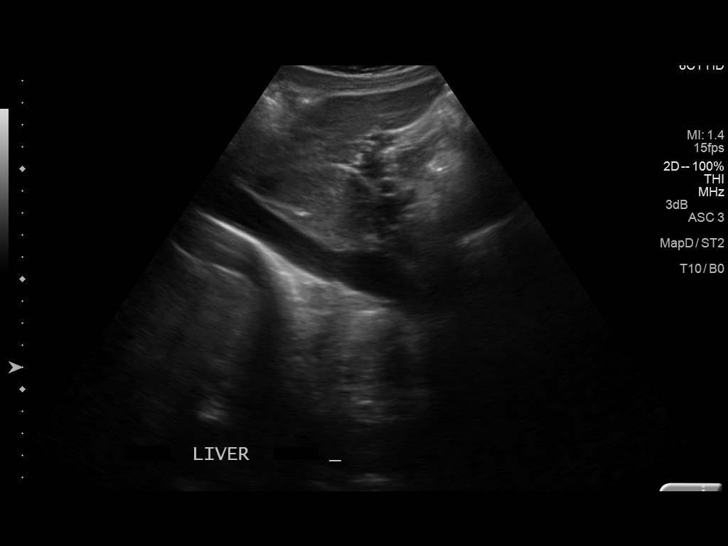
[im 22/47]
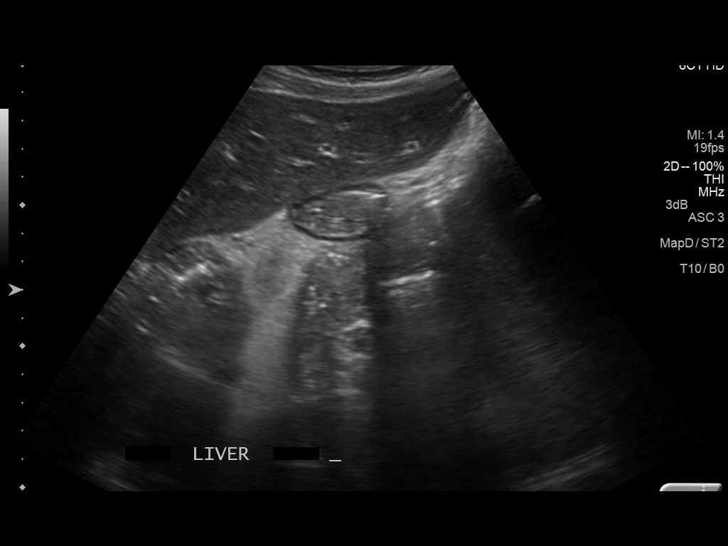
[im 25/47]
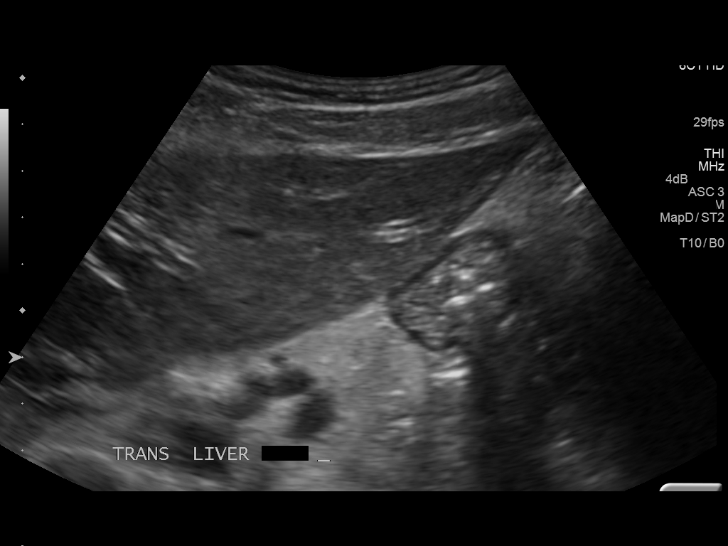
[im 29/47]
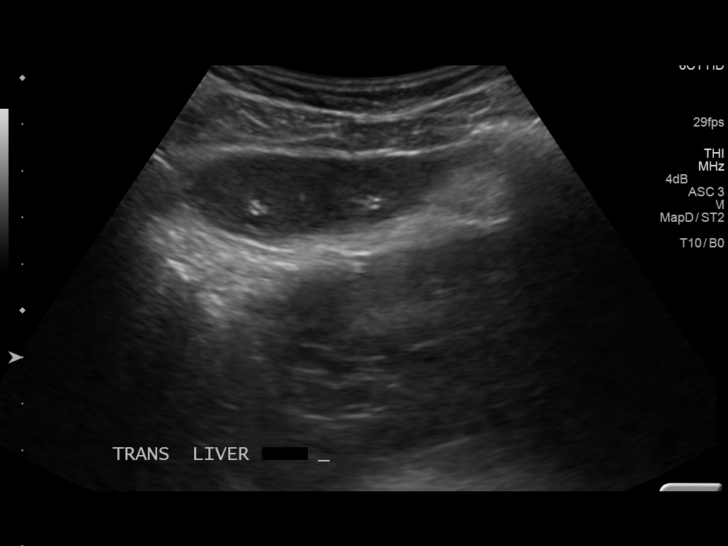
[im 31/47]
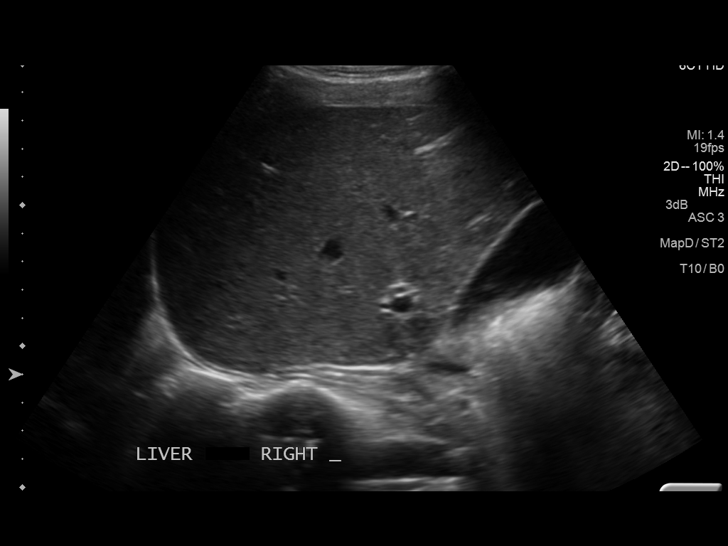
[im 35/47]
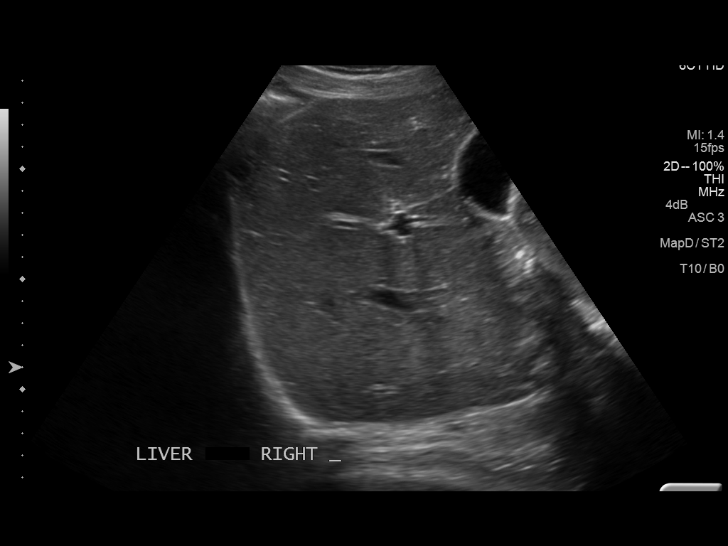
[im 39/47]
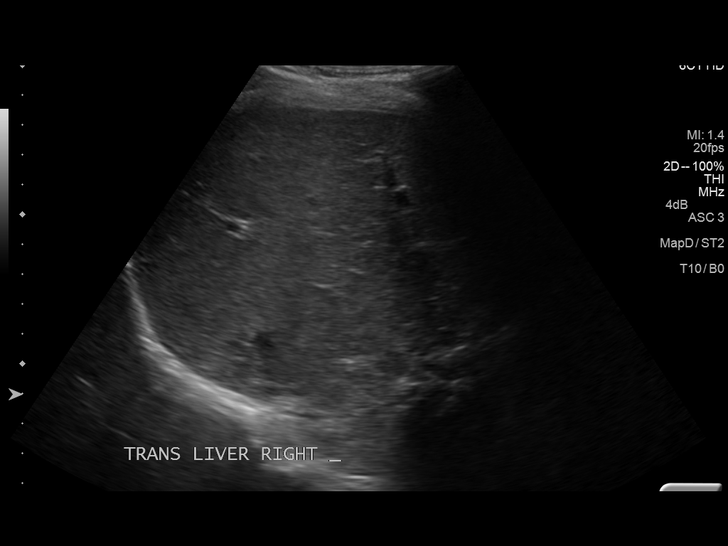
[im 43/47]
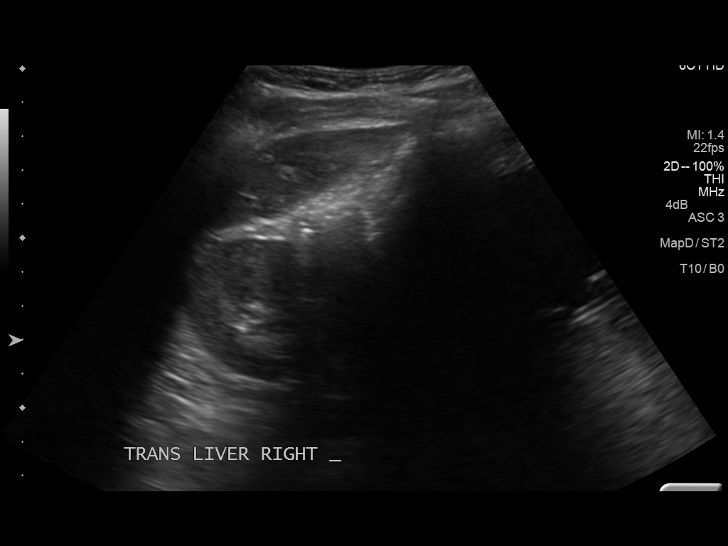
[im 47/47]
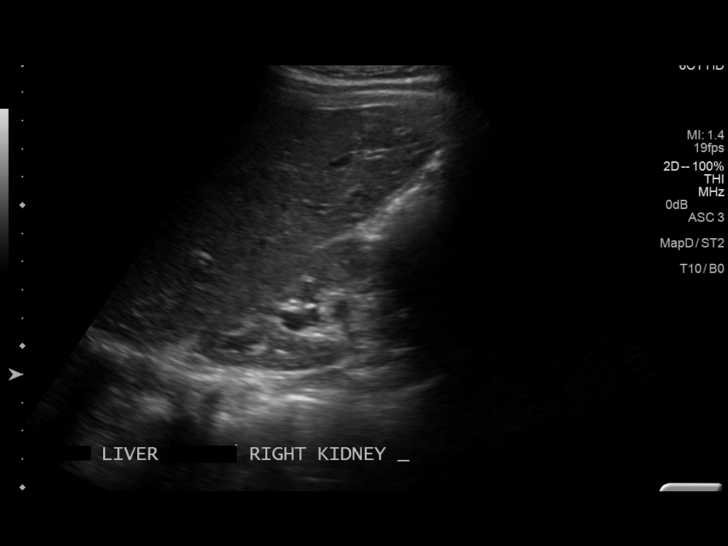

[14 of 25 positions shown; findings below may reference images not displayed]

FINDINGS: Gallbladder:

No gallstones or wall thickening visualized. No sonographic Murphy
sign noted.

Common bile duct:

Diameter: 3 mm

Liver:

No focal lesion identified. Within normal limits in parenchymal
echogenicity.
IMPRESSION: No cholelithiasis or sonographic evidence for acute cholecystitis.

No biliary ductal dilatation.
